# Patient Record
Sex: Male | Born: 1985 | Race: White | Hispanic: No | Marital: Single | Smoking: Never smoker
Health system: Southern US, Community
[De-identification: ages and names within clinical notes are randomized; demographics above are authoritative.]

## PROBLEM LIST (undated history)

## (undated) DIAGNOSIS — I2699 Other pulmonary embolism without acute cor pulmonale: Secondary | ICD-10-CM

## (undated) DIAGNOSIS — D689 Coagulation defect, unspecified: Secondary | ICD-10-CM

## (undated) DIAGNOSIS — I82409 Acute embolism and thrombosis of unspecified deep veins of unspecified lower extremity: Secondary | ICD-10-CM

## (undated) HISTORY — PX: HEMORROIDECTOMY: SUR656

---

## 2010-02-18 ENCOUNTER — Emergency Department (HOSPITAL_BASED_OUTPATIENT_CLINIC_OR_DEPARTMENT_OTHER)
Admission: EM | Admit: 2010-02-18 | Discharge: 2010-02-18 | Payer: Self-pay | Source: Home / Self Care | Admitting: Emergency Medicine

## 2010-09-19 ENCOUNTER — Emergency Department (HOSPITAL_COMMUNITY)

## 2010-09-19 ENCOUNTER — Inpatient Hospital Stay (HOSPITAL_COMMUNITY)
Admission: EM | Admit: 2010-09-19 | Discharge: 2010-09-21 | DRG: 176 | Disposition: A | Discharge status: Home / Self Care | Source: Ambulatory Visit | Attending: Internal Medicine | Admitting: Internal Medicine

## 2010-09-19 DIAGNOSIS — I2699 Other pulmonary embolism without acute cor pulmonale: Principal | ICD-10-CM | POA: Diagnosis present

## 2010-09-19 DIAGNOSIS — Z7901 Long term (current) use of anticoagulants: Secondary | ICD-10-CM

## 2010-09-19 DIAGNOSIS — Z86718 Personal history of other venous thrombosis and embolism: Secondary | ICD-10-CM

## 2010-09-19 LAB — POCT I-STAT, CHEM 8
BUN: 33 mg/dL — ABNORMAL HIGH (ref 6–23)
Calcium, Ion: 1.22 mmol/L (ref 1.12–1.32)
Chloride: 104 mEq/L (ref 96–112)
Glucose, Bld: 87 mg/dL (ref 70–99)

## 2010-09-19 LAB — DIFFERENTIAL
Eosinophils Absolute: 0.1 10*3/uL (ref 0.0–0.7)
Lymphocytes Relative: 32 % (ref 12–46)
Lymphs Abs: 2.4 10*3/uL (ref 0.7–4.0)
Monocytes Relative: 8 % (ref 3–12)
Neutrophils Relative %: 58 % (ref 43–77)

## 2010-09-19 LAB — CBC
HCT: 43.4 % (ref 39.0–52.0)
MCH: 29.9 pg (ref 26.0–34.0)
MCV: 83.6 fL (ref 78.0–100.0)
Platelets: 143 10*3/uL — ABNORMAL LOW (ref 150–400)
RBC: 5.19 MIL/uL (ref 4.22–5.81)

## 2010-09-20 LAB — CARDIAC PANEL(CRET KIN+CKTOT+MB+TROPI)
CK, MB: 4.5 ng/mL — ABNORMAL HIGH (ref 0.3–4.0)
CK, MB: 4.6 ng/mL — ABNORMAL HIGH (ref 0.3–4.0)
Relative Index: 1.8 (ref 0.0–2.5)
Relative Index: 2.2 (ref 0.0–2.5)
Total CK: 249 U/L — ABNORMAL HIGH (ref 7–232)
Total CK: 210 U/L (ref 7–232)
Troponin I: <0.3 ng/mL (ref <0.30)

## 2010-09-20 LAB — BASIC METABOLIC PANEL
CO2: 26 mEq/L (ref 19–32)
Chloride: 104 mEq/L (ref 96–112)
Creatinine, Ser: 1.24 mg/dL (ref 0.50–1.35)
GFR calc Af Amer: >60 mL/min (ref >60)
Potassium: 3.8 mEq/L (ref 3.5–5.1)
Sodium: 137 mEq/L (ref 135–145)

## 2010-09-20 LAB — HOMOCYSTEINE: Homocysteine: 7.5 umol/L (ref 4.0–15.4)

## 2010-09-20 LAB — MAGNESIUM: Magnesium: 2.1 mg/dL (ref 1.5–2.5)

## 2010-09-20 MED ORDER — IOHEXOL 300 MG/ML  SOLN
80.0000 mL | Freq: Once | INTRAMUSCULAR | Status: AC | PRN
  Administered 2010-09-20: 80 mL via INTRAVENOUS

## 2010-09-21 LAB — CBC
MCHC: 34.5 g/dL (ref 30.0–36.0)
Platelets: 130 10*3/uL — ABNORMAL LOW (ref 150–400)
RDW: 13.1 % (ref 11.5–15.5)
WBC: 5.6 10*3/uL (ref 4.0–10.5)

## 2010-09-21 LAB — PROTEIN C ACTIVITY: Protein C Activity: 107 % (ref 75–133)

## 2010-09-21 LAB — PROTEIN S, TOTAL: Protein S Ag, Total: 159 % — ABNORMAL HIGH (ref 60–150)

## 2010-09-21 LAB — LUPUS ANTICOAGULANT PANEL
Lupus Anticoagulant: NOT DETECTED
PTTLA 4:1 Mix: 57.4 secs — ABNORMAL HIGH (ref 30.0–45.6)
PTTLA Confirmation: 2.4 secs (ref <8.0)
dRVVT Incubated 1:1 Mix: 39.2 secs (ref 36.2–44.3)

## 2010-09-21 LAB — FACTOR 5 LEIDEN

## 2010-09-21 LAB — PROTIME-INR: INR: 1.06 (ref 0.00–1.49)

## 2010-09-21 LAB — ANTITHROMBIN III: AntiThromb III Func: 95 % (ref 76–126)

## 2010-09-28 LAB — CARDIOLIPIN ANTIBODIES, IGG, IGM, IGA
Anticardiolipin IgA: 7 APL U/mL — ABNORMAL LOW (ref <22)
Anticardiolipin IgG: 2 GPL U/mL — ABNORMAL LOW (ref <23)
Anticardiolipin IgM: 2 MPL U/mL — ABNORMAL LOW (ref <11)

## 2010-09-28 LAB — BETA-2-GLYCOPROTEIN I ABS, IGG/M/A
Beta-2-Glycoprotein I IgA: 3 A Units (ref <20)
Beta-2-Glycoprotein I IgM: 4 M Units (ref <20)

## 2010-10-16 NOTE — H&P (Signed)
Scott Baldwin, Scott Baldwin             ACCOUNT NO.:  [id number]  MEDICAL RECORD NO.:  [id number]  LOCATION:  [facility]                         FACILITY:  [facility]  PHYSICIAN:  Richarda Overlie, MD       DATE OF BIRTH:  1985/07/13  DATE OF ADMISSION:  09/19/2010 DATE OF DISCHARGE:                             HISTORY & PHYSICAL   PRIMARY CARE PHYSICIAN:  Unassigned.  CHIEF COMPLAINTS:  Shortness of breath.  SUBJECTIVE:  A 25 year old male who presents with a chief complaint of shortness of breath to the ER at around 11:15 p.m.  The patient states that in January he was diagnosed with pulmonary embolism on February 28, 2010, and he was taking Coumadin through August 04, 2010.  The patient started noticing that 2 weeks ago he has some dyspnea on exertion associated with fatigue with extreme physical exertion.  He also complained of occasional chest pain.  He states that he has been having some right knee pain especially when he exerts himself physically.  He has also been taking Celebrex for hand injury.  He feels that his symptoms have been worsening over the last 2 weeks.  The patient denies any cough, chills, rigors, or fever.  He denies any leg swelling, any recent history of travel.  PAST MEDICAL HISTORY:  History of pulmonary embolism, recently completed a 62-month course of Coumadin.  PAST SURGICAL HISTORY:  None.  SOCIAL HISTORY:  Nonsmoker, nonalcoholic.  Denies any history of drug use.  ALLERGIES:  No known drug allergies.  HOME MEDICATIONS:  Celebrex as needed.  REVIEW OF SYSTEMS:  Complete review of systems was done as documented in HPI.  FAMILY HISTORY:  Negative for any history of pulmonary embolism.  PHYSICAL EXAMINATION:  VITAL SIGNS:  Blood pressure 128/71, pulse of 60, respirations 18, temperature 98.0. GENERAL:  Comfortable, in no acute cardiopulmonary distress. HEENT:  Pupils equal and reactive.  Extraocular movements intact. NECK:  Supple.  No JVD. LUNGS:  Clear to  auscultation bilaterally.  No wheezes, crackles, or rhonchi. CARDIOVASCULAR:  Regular rate and rhythm.  No murmurs, rubs, or gallops. ABDOMEN:  Obese, soft, nontender, nondistended. EXTREMITIES:  Without cyanosis, clubbing, or edema. NEUROLOGIC:  Cranial nerves II through XII grossly intact. PSYCHIATRIC:  Appropriate mood and affect.  CT angio shows acute pulmonary embolism with bilateral lobar segmental branches.  No CT evidence of right ventricular strain.  INR 1.06.  WBC 7.5, hemoglobin 15.5, hematocrit 43.4, and platelet count of 143. Sodium 139, potassium 4.4, chloride 104, bicarbonate is 33, creatinine 1.4, glucose 87.  ASSESSMENT AND PLAN:  Acute bilateral pulmonary embolism as evident on CT angio of the chest, which is bilateral lobar and segmental pulmonary embolism without any evidence of right ventricular heart strain.  The patient will be admitted to the telemetry floor.  We will cycle his cardiac enzymes.  We will start him on Lovenox and Coumadin.  The patient will be monitored for arrhythmias.  He may be transitioned to Lovenox at home with continuation of Coumadin with close followup of his PT/INR in an outpatient setting.  We will also order a hypercoagulable panel.  He is a full code.     Richarda Overlie, MD  NA/MEDQ  D:  09/20/2010  T:  09/20/2010  Job:  [id number]  Electronically Signed by Richarda Overlie MD on 10/16/2010 10:24:52 PM

## 2010-10-22 NOTE — Discharge Summary (Signed)
NAMECHAVIS, Scott Baldwin             ACCOUNT NO.:  [id number]  MEDICAL RECORD NO.:  [id number]  LOCATION:                                 FACILITY:  PHYSICIAN:  Clydia Llano, MD       DATE OF BIRTH:  05-08-1985  DATE OF ADMISSION: DATE OF DISCHARGE:                              DISCHARGE SUMMARY   PRIMARY CARE PHYSICIAN:  Lucendia Herrlich, nurse practitioner at [facility] [employer].  REASON FOR ADMISSION:  Shortness of breath.  DISCHARGE DIAGNOSES: 1. Acute bilateral pulmonary embolism, recurrent. 2. 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  DISCHARGE MEDICATIONS: 1. Lovenox 100 mg subcu b.i.d. for seven more days. 2. Coumadin 10 mg p.o. daily. 3. Fish oil OTC 2 capsules p.o. b.i.d. 4. Celebrex 100 mg p.o. b.i.d.  BRIEF HISTORY AND EXAMINATION:  Mr. Scott Baldwin is a 25 year old male with past medical history of PE, diagnosed earlier this year in January.  The patient was taking Coumadin until August 04, 2010.  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  He was also diagnosed with right lower extremity DVT as he mentioned.  He was put on Coumadin for 6 months and was discontinued in the beginning of June.  The patient started to notice about 2 weeks ago to have some dyspnea which progressed associated with fatigue and extreme physical exertion.  The patient also has some occasional chest pain.  The patient came into the hospital for further medical evaluation.  Upon initial evaluation in the emergency department, CT angio of the chest was done showed acute pulmonary embolism.  RADIOLOGY:  His CT angio showed acute pulmonary embolism with an bilateral lobar and segmental branches.  There is no CT evidence of right ventricular heart strain.  BRIEF HOSPITAL COURSE: 1. Bilateral PE.  As mentioned above, this is recurrent.  The first     time it was diagnosed in January 2012.  The patient did not know     what  was the cause at that time.  The patient also does not have     any risk factors because of his age.  Hypercoagulable panel was     drawn in [facility] emergency department.  This can be obtained at     the request.  It still pending at the time of discharge.  The only     things returned was antithrombin III enzymes, which is normal at 90     and homocysteine level normal at 7.5.  The rest of it is pending.     The patient admitted to telemetry bed, although, there is no     evidence of heart strain on the CT scan, was put on telemetry and     treated with heparin initially and then switched to subcutaneous     Lovenox with Coumadin.  His INR at the time of discharge is 1.0.     The patient on 10 mg of Coumadin and he will be overlapping with     the Lovenox for at least 7 days or INR more than between 2 and 3     for  2 days while he is on Lovenox.  The duration of the     anticoagulation will be a question.  This is his second PE after     only about 1 month off of the anticoagulation.  I would recommend     to wait to see the hypercoagulable panel, and if the patient has     hypercoagulable state, he might require the anticoagulation for     life.  He might also consider to refer to hematologist. 2. Prior history of right lower extremity DVT.  Doppler of the lower     extremity was not done at this time because it was not changed the     management as the patient will get at least 6 months of     anticoagulation.  DISCHARGE INSTRUCTIONS: 1. Activity:  As tolerated. 2. Diet:  Regular. 3. Disposition:  Home.  SPECIAL INSTRUCTION:  Check INR on Monday when see his primary care physician.     Clydia Llano, MD     ME/MEDQ  D:  09/21/2010  T:  09/21/2010  Job:  [id number]  cc:   Lucendia Herrlich, FNP  Electronically Signed by Clydia Llano  on 10/22/2010 09:58:36 AM

## 2011-02-11 ENCOUNTER — Emergency Department (HOSPITAL_BASED_OUTPATIENT_CLINIC_OR_DEPARTMENT_OTHER)
Admission: EM | Admit: 2011-02-11 | Discharge: 2011-02-11 | Disposition: A | Discharge status: Home / Self Care | Payer: Managed Care, Other (non HMO) | Attending: Emergency Medicine | Admitting: Emergency Medicine

## 2011-02-11 DIAGNOSIS — M79601 Pain in right arm: Secondary | ICD-10-CM

## 2011-02-11 DIAGNOSIS — R791 Abnormal coagulation profile: Secondary | ICD-10-CM | POA: Insufficient documentation

## 2011-02-11 DIAGNOSIS — Z86718 Personal history of other venous thrombosis and embolism: Secondary | ICD-10-CM | POA: Insufficient documentation

## 2011-02-11 DIAGNOSIS — M79609 Pain in unspecified limb: Secondary | ICD-10-CM | POA: Insufficient documentation

## 2011-02-11 HISTORY — DX: Acute embolism and thrombosis of unspecified deep veins of unspecified lower extremity: I82.409

## 2011-02-11 HISTORY — DX: Coagulation defect, unspecified: D68.9

## 2011-02-11 HISTORY — DX: Other pulmonary embolism without acute cor pulmonale: I26.99

## 2011-02-11 MED ORDER — HYDROCODONE-ACETAMINOPHEN 5-325 MG PO TABS
1.0000 | ORAL_TABLET | Freq: Once | ORAL | Status: AC
  Administered 2011-02-11: 1 via ORAL
  Filled 2011-02-11: qty 1

## 2011-02-11 MED ORDER — HYDROCODONE-ACETAMINOPHEN 5-325 MG PO TABS: 1.0000 | ORAL_TABLET | Freq: Four times a day (QID) | ORAL | Status: AC | PRN

## 2011-02-11 NOTE — ED Provider Notes (Signed)
History     CSN: [id number] Arrival date & time: 02/11/2011  8:38 AM   First MD Initiated Contact with Patient 02/11/11 [phone]      Chief Complaint  Patient presents with  . Arm Pain    (Consider location/radiation/quality/duration/timing/severity/associated sxs/prior treatment) Patient is a 25 y.o. male presenting with arm pain.  Arm Pain Associated symptoms include myalgias and weakness. Pertinent negatives include no abdominal pain, arthralgias, chest pain, fever, headaches, joint swelling, nausea, neck pain or rash.   Mr. 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  Mr. Clarita Leber states he was doing bicep curles on Friday night and started to notice a pain in his right forearm. There was no acute onset. No pulling, snapping or acute pain. He states this was a soreness located first near the lateral epicondyle and proximal lateral forearm. On Saturday he had increasing stiffness in the right upper arm. On Sunday he awoke with a sore "knot" in the proximal, medial portion of his RUE.   He currently describes the pain as a stain-like pulling in the RUE and a sore ache in near the R lateral epicondyle. He denies any radiation. The pain is aggravated by straightening the arm and stretching. It is relieved by resting the arm. He took Tylenol this morning for his arm pain but states it did not help relieve it. The pain is 6/10 upon stretching the arm and 2/10 at rest. The pain does not radiate.  Mr Aggie Hacker states he has been changing his diet around recently and has an appointment with his warfarin clinic provider on Thursday.  Denies any chest pain, shortness of breath, lower extremity pain, lower extremity swelling, bruising or rash.    Past Medical History  Diagnosis Date  . Clotting disorder   . Pulmonary embolism   . DVT (deep venous thrombosis)     Past  Surgical History  Procedure Date  . Hemorroidectomy     No family history on file.  History  Substance Use Topics  . Smoking status: Never Smoker   . Smokeless tobacco: Never Used  . Alcohol Use: No      Review of Systems  Constitutional: Negative for fever and activity change.  HENT: Negative for neck pain.   Respiratory: Negative for chest tightness, shortness of breath and wheezing.   Cardiovascular: Negative for chest pain, palpitations and leg swelling.  Gastrointestinal: Negative for nausea, abdominal pain and abdominal distention.  Musculoskeletal: Positive for myalgias. Negative for joint swelling and arthralgias.  Skin: Negative for color change, pallor, rash and wound.  Neurological: Positive for weakness. Negative for dizziness and headaches.    Allergies  Review of patient's allergies indicates no known allergies.  Home Medications   Current Outpatient Rx  Name Route Sig Dispense Refill  . MULTIVITAMINS PO CAPS Oral Take 1 capsule by mouth daily.      . WARFARIN SODIUM 1 MG PO TABS Oral Take 10 mg by mouth as directed.      . WARFARIN SODIUM 1 MG PO TABS Oral Take 15 mg by mouth once a week. Tuesday     . HYDROCODONE-ACETAMINOPHEN 5-325 MG PO TABS Oral Take 1 tablet by mouth every 6 (six) hours as needed for pain. 30 tablet 0    BP 145/88  Pulse 79  Temp(Src) 97.7 F (36.5 C) (Oral)  Resp 18  Ht 5\' 7"  (  1.702 m)  Wt 230 lb (104.327 kg)  BMI 36.02 kg/m2  SpO2 100%  Physical Exam  Constitutional: He is oriented to person, place, and time. He appears well-developed and well-nourished. No distress.  HENT:  Head: Normocephalic and atraumatic.  Eyes: Conjunctivae are normal. Pupils are equal, round, and reactive to light.  Neck: Normal range of motion.  Cardiovascular: Normal rate, regular rhythm, normal heart sounds and intact distal pulses.  Exam reveals no gallop and no friction rub.   No murmur heard. Pulmonary/Chest: Effort normal and breath sounds  normal. No respiratory distress. He has no wheezes. He has no rales. He exhibits no tenderness.  Abdominal: Soft. Bowel sounds are normal. He exhibits no distension. There is no tenderness.  Musculoskeletal: He exhibits tenderness.       Right shoulder: He exhibits normal range of motion, no tenderness, no bony tenderness, no deformity and no pain.       Right elbow: tenderness found. Lateral epicondyle tenderness noted.       Right wrist: Normal.       Arms:      Asymmetry between the left and right arm. Right arm is slightly larger with a 4 cm subcutaneous mass on the proximal medial upper arm. Radial pulses intact bilaterally. No pallor, rash, ecchymosis, or skin change beside stretch marks in the right arm.  Neurological: He is alert and oriented to person, place, and time. No cranial nerve deficit.  Skin: Skin is warm, dry and intact. No abrasion, no bruising and no rash noted. He is not diaphoretic. No cyanosis or erythema. No pallor.  Psychiatric: He has a normal mood and affect. His behavior is normal. Judgment and thought content normal.    ED Course  Procedures (including critical care time)  Labs Reviewed  PROTIME-INR - Abnormal; Notable for the following:    Prothrombin Time 48.2 (*)    INR 5.15 (*)    All other components within normal limits   No results found.   1. Arm pain, right   2. Supratherapeutic INR       MDM  Pt's pain is likely secondary to partial tear of brachialis or coracobrachialis muscle in the RUE with or without concomitant hematoma. Upper extremity DVT is unlikely, as INR is supratherapeutic, which he was at 5.15 today. No tenseness, pallor, paraesthesia, or loss of pulses in the RUE to suggest compartment syndrome. Will refer to sports medicine in [place] for follow-up in the next week. Will treat pain with short course of Vicodin. Will have pt hold today's and tomorrow's doses of his warfarin and follow-up with Beecher Mcardle in the warfarin clinic  at [facility] to adjust his warfarin dose.         Quentin Ore, MD 02/11/11 1004

## 2011-02-11 NOTE — ED Notes (Signed)
Pt c/o pain in right bicep that started Friday after lifting weights.

## 2011-02-11 NOTE — ED Provider Notes (Signed)
I saw and evaluated the patient, reviewed the resident's note and I agree with the findings and plan.  This is mild tenderness in his right bicep without signs of compartment syndrome.  He has normal right radial pulse.  He has normal strength in his right upper extremity.  I suspect this is a muscle strain.  His INR is elevated he's and told to hold his Coumadin x2 days and recheck with his primary care Dr. regarding his INR.  I doubt this is a DVT especially given his supratherapeutic INR.  DC home with pain medicine and referral to sports medicine for likely outpatient physical therapy and imaging such as MRI as warranted  1. Arm pain, right   2. Supratherapeutic INR    Results for orders placed during the hospital encounter of 02/11/11  PROTIME-INR      Component Value Range   Prothrombin Time 48.2 (*) 11.6 - 15.2 (seconds)   INR 5.15 (*) 0.00 - 1.49      Lyanne Co, MD 02/11/11 1009

## 2011-02-11 NOTE — ED Notes (Signed)
MD at bedside. 

## 2011-02-14 ENCOUNTER — Ambulatory Visit
Admission: RE | Admit: 2011-02-14 | Discharge: 2011-02-14 | Disposition: A | Discharge status: Home / Self Care | Source: Ambulatory Visit | Attending: Orthopedic Surgery | Admitting: Orthopedic Surgery

## 2011-02-14 ENCOUNTER — Other Ambulatory Visit: Payer: Self-pay | Admitting: Orthopedic Surgery

## 2011-02-14 DIAGNOSIS — M25521 Pain in right elbow: Secondary | ICD-10-CM

## 2011-02-20 ENCOUNTER — Ambulatory Visit: Admitting: Family Medicine

## 2011-03-11 ENCOUNTER — Ambulatory Visit: Admitting: Physical Therapy

## 2012-07-13 IMAGING — CT CT ANGIO CHEST
2 of 7 series · 18 of 36 positions shown · IV contrast (CONTRAST)
Comparison: None.

CLINICAL DATA: Shortness of breath

CT ANGIOGRAPHY CHEST WITH CONTRAST
TECHNIQUE: Multidetector CT imaging of the chest was performed
using the standard protocol during bolus administration of
intravenous contrast.  Multiplanar CT image reconstructions
including MIPs were obtained to evaluate the vascular anatomy.
Contrast:  80 ml of Mmnipaque-UPP intravenous contrast

[Series 5: pe thins · axial · 0.71mm/px · z∈[-279,-32]mm · 17 of 279 slices shown]
[im 16/279  lung]
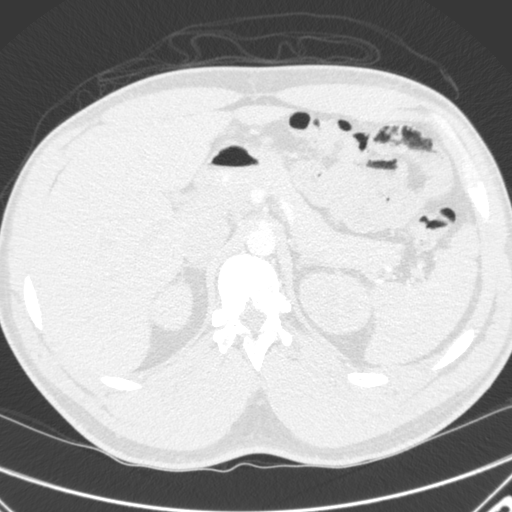
[im 31/279  mediastinal]
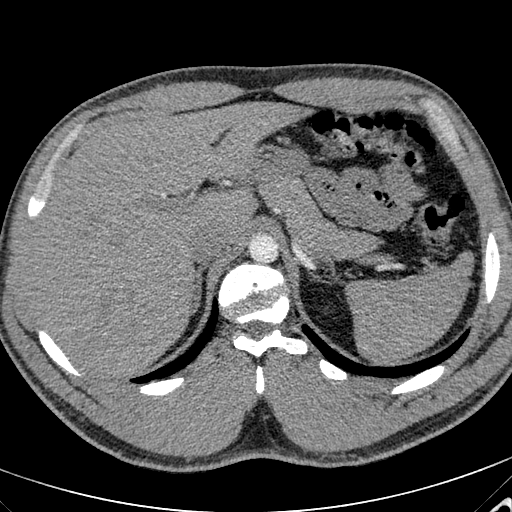
[im 47/279  lung]
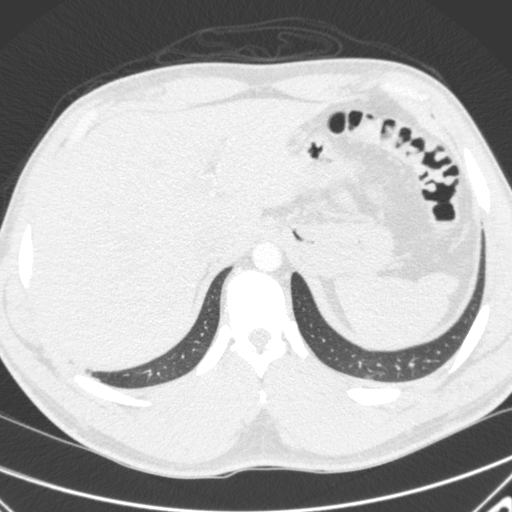
[im 62/279  mediastinal]
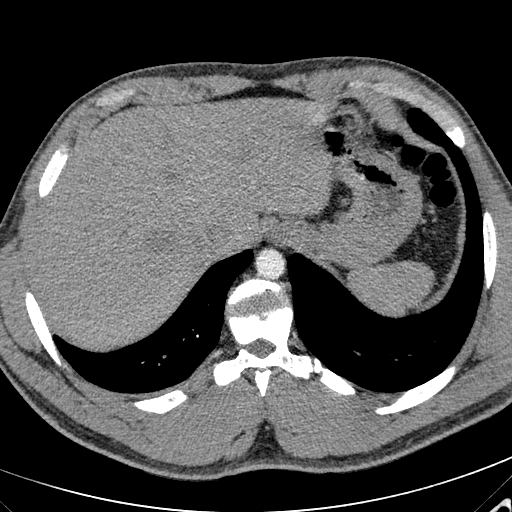
[im 78/279  lung]
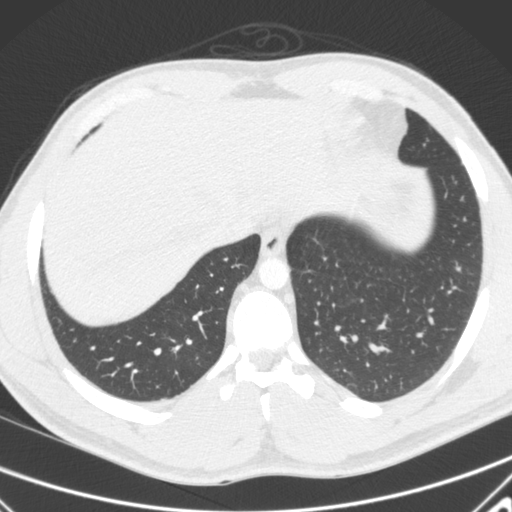
[im 93/279  mediastinal]
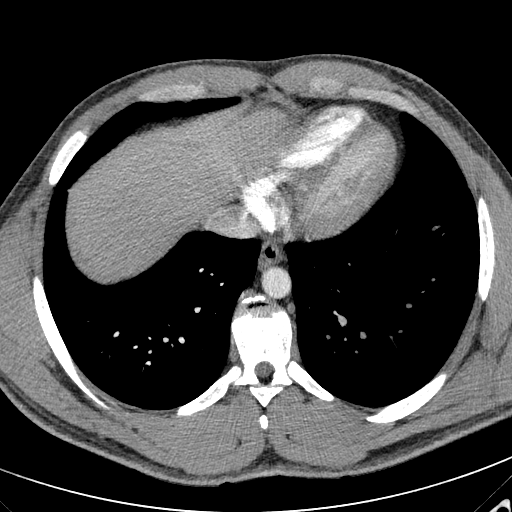
[im 109/279  lung]
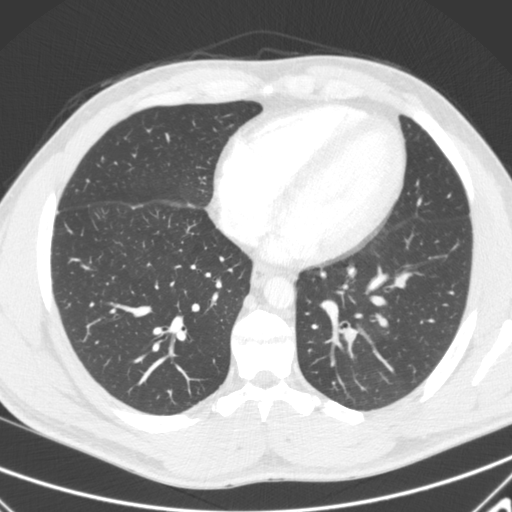
[im 124/279  mediastinal]
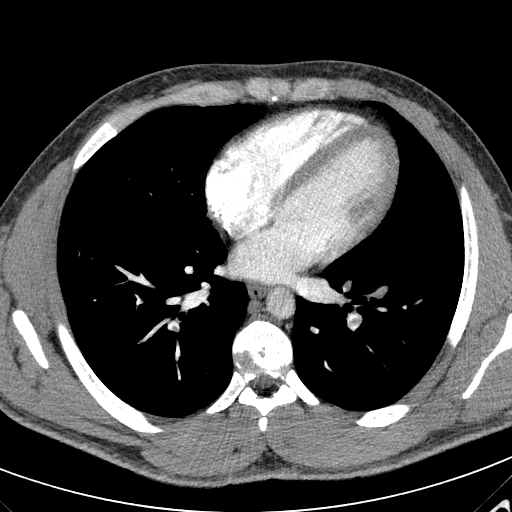
[im 140/279  lung]
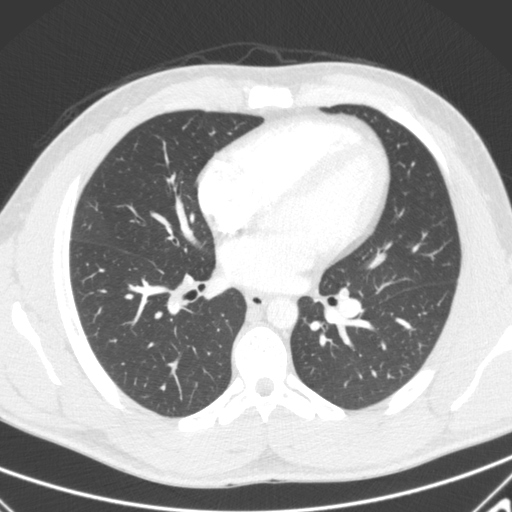
[im 155/279  mediastinal]
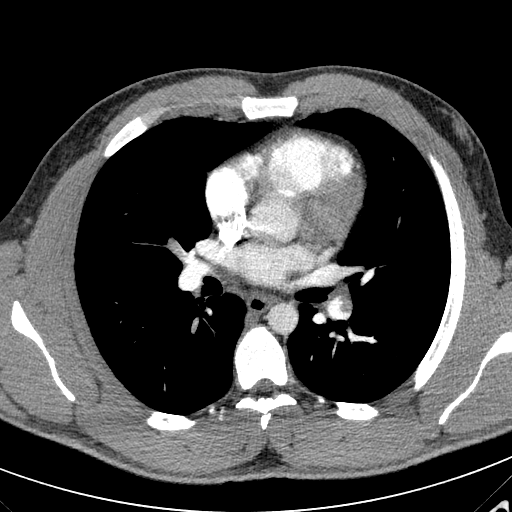
[im 170/279  lung]
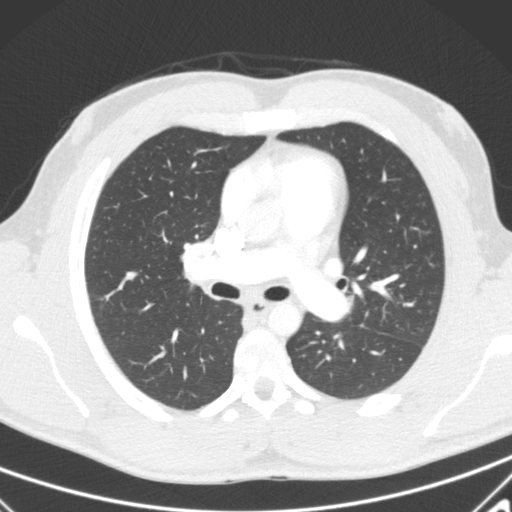
[im 186/279  mediastinal]
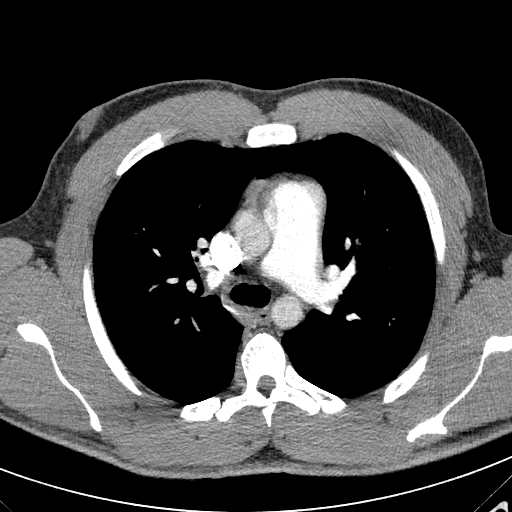
[im 201/279  lung]
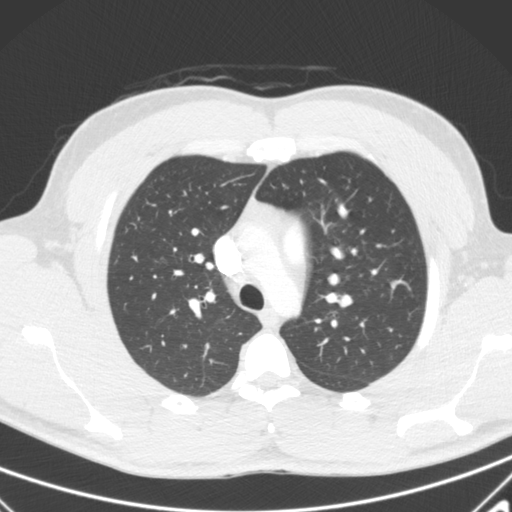
[im 217/279  mediastinal]
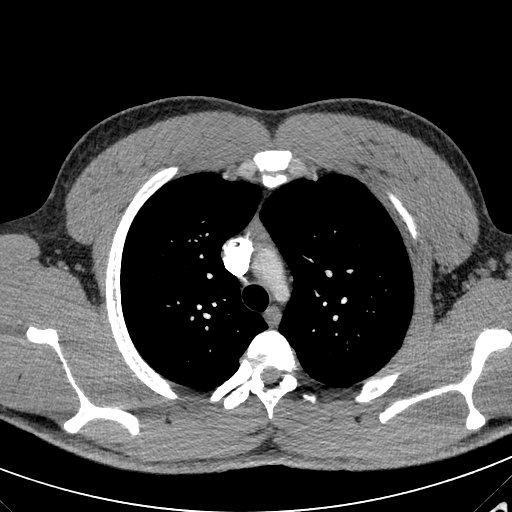
[im 232/279  lung]
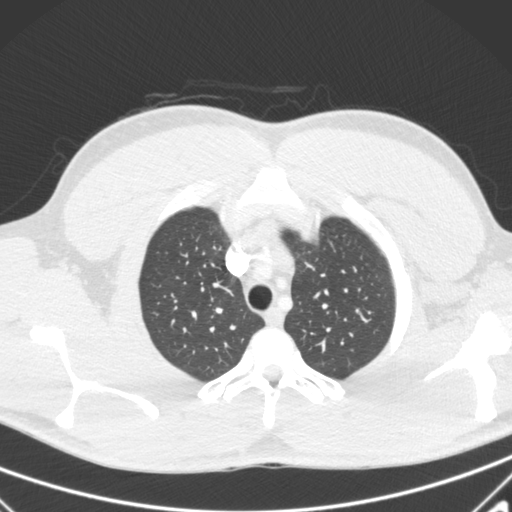
[im 248/279  mediastinal]
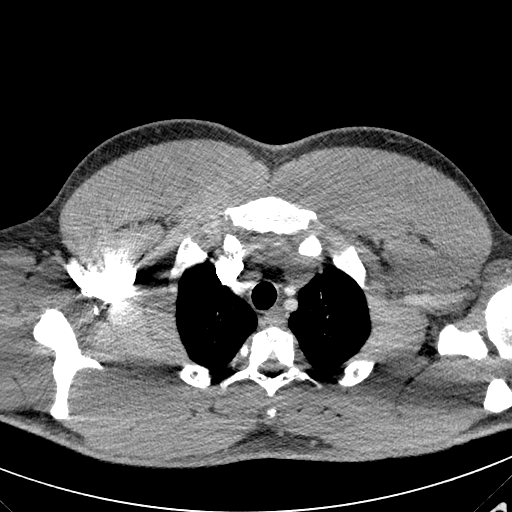
[im 263/279  lung]
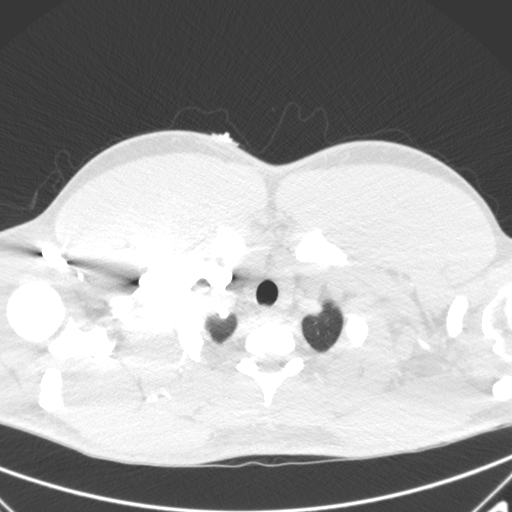

[mpr, coronals, coronal · coronal · 0.71mm/px · 1 of 148 slices shown]
[im 74/148  mediastinal]
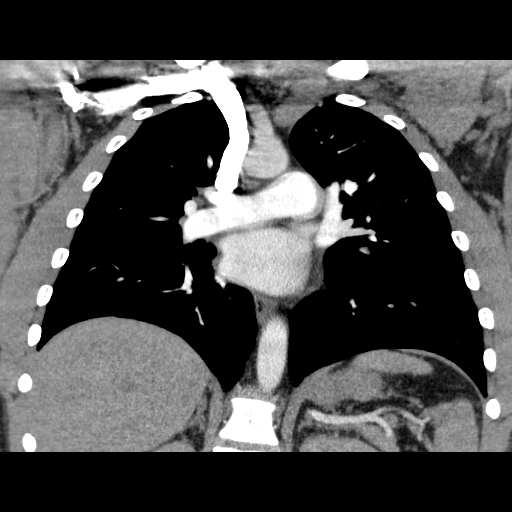

[18 of 36 positions shown; findings below may reference images not displayed]

FINDINGS: Bilateral lobar and segmental branch filling defects to
lower lobe and right middle lobe branches, in keeping with acute
pulmonary emboli.

Heart size within normal limits.  No definite evidence for right
ventricular heart strain.  No pericardial or pleural effusion.  No
lymphadenopathy.

Limited images through the upper abdomen show no acute abnormality.

Central airways are patent.  Lungs are predominately clear.  No
pneumothorax.

No aggressive osseous lesions, multilevel Schmorl's nodes and mild
height loss, can be seen with Ri disease.

Review of the MIP images confirms the above findings.
IMPRESSION: Acute pulmonary emboli within bilateral lobar and segmental
branches.  No CT evidence for right ventricular heart strain.

## 2013-07-22 ENCOUNTER — Encounter (HOSPITAL_COMMUNITY): Payer: Self-pay | Admitting: Emergency Medicine

## 2013-07-22 ENCOUNTER — Emergency Department (HOSPITAL_COMMUNITY)
Admission: EM | Admit: 2013-07-22 | Discharge: 2013-07-22 | Disposition: A | Discharge status: Home / Self Care | Payer: Non-veteran care | Attending: Emergency Medicine | Admitting: Emergency Medicine

## 2013-07-22 DIAGNOSIS — K644 Residual hemorrhoidal skin tags: Secondary | ICD-10-CM | POA: Diagnosis not present

## 2013-07-22 DIAGNOSIS — K5900 Constipation, unspecified: Secondary | ICD-10-CM

## 2013-07-22 DIAGNOSIS — Z9889 Other specified postprocedural states: Secondary | ICD-10-CM | POA: Diagnosis not present

## 2013-07-22 DIAGNOSIS — K4090 Unilateral inguinal hernia, without obstruction or gangrene, not specified as recurrent: Secondary | ICD-10-CM | POA: Diagnosis present

## 2013-07-22 DIAGNOSIS — T148XXA Other injury of unspecified body region, initial encounter: Secondary | ICD-10-CM

## 2013-07-22 DIAGNOSIS — Y939 Activity, unspecified: Secondary | ICD-10-CM | POA: Diagnosis not present

## 2013-07-22 DIAGNOSIS — Z86711 Personal history of pulmonary embolism: Secondary | ICD-10-CM | POA: Insufficient documentation

## 2013-07-22 DIAGNOSIS — Y929 Unspecified place or not applicable: Secondary | ICD-10-CM | POA: Insufficient documentation

## 2013-07-22 DIAGNOSIS — X58XXXA Exposure to other specified factors, initial encounter: Secondary | ICD-10-CM | POA: Diagnosis not present

## 2013-07-22 DIAGNOSIS — Z7901 Long term (current) use of anticoagulants: Secondary | ICD-10-CM | POA: Diagnosis not present

## 2013-07-22 DIAGNOSIS — Z86718 Personal history of other venous thrombosis and embolism: Secondary | ICD-10-CM | POA: Insufficient documentation

## 2013-07-22 DIAGNOSIS — IMO0002 Reserved for concepts with insufficient information to code with codable children: Secondary | ICD-10-CM | POA: Diagnosis not present

## 2013-07-22 LAB — POC OCCULT BLOOD, ED: Fecal Occult Bld: NEGATIVE

## 2013-07-22 MED ORDER — POLYETHYLENE GLYCOL 3350 17 G PO PACK: 17.0000 g | PACK | Freq: Two times a day (BID) | ORAL | Status: AC

## 2013-07-22 NOTE — ED Notes (Signed)
Per patient: has had an inguinal hernia since October 2013, has seen a physician since that time but states since last Thursday he has had difficulty having a bowel movement and states that there is a dull pain constantly but when he strains to have a bowel movement the pain increases but not excruciatingly so.  Has not had a normal bowel movement since last Thursday and states that there is pressure also when he urinates in his lower abdomen.  Denies fever and chills, left inguinal hernia noticed that looks reduced but recurs with straining.

## 2013-07-22 NOTE — ED Notes (Signed)
Dr. Thekkekandam at bedside 

## 2013-07-22 NOTE — Discharge Instructions (Signed)
You likely have a muscle strain along with constipation causing your symptoms. Take miralax daily or twice daily until stooling normally. You can also take colace (stool softener) to help if needed. Be sure to stay hydrated and eat foods high in fiber. This does not seem to be related to the hernia though it is likely you had a small muscle strain causing some of your pain. There was no blood in your stool today. Your bleeding was likely from your hemorrhoid. If you develop fevers, nausea, vomiting, or other concerns, seek immediate care.   Constipation, Adult Constipation is when a person:  Poops (bowel movement) less than 3 times a week.  Has a hard time pooping.  Has poop that is dry, hard, or bigger than normal. HOME CARE   Eat more fiber, such as fruits, vegetables, whole grains like brown rice, and beans.  Eat less fatty foods and sugar. This includes [place] fries, hamburgers, cookies, candy, and soda.  If you are not getting enough fiber from food, take products with added fiber in them (supplements).  Drink enough fluid to keep your pee (urine) clear or pale yellow.  Go to the restroom when you feel like you need to poop. Do not hold it.  Only take medicine as told by your doctor. Do not take medicines that help you poop (laxatives) without talking to your doctor first.  Exercise on a regular basis, or as told by your doctor. GET HELP RIGHT AWAY IF:   You have bright red blood in your poop (stool).  Your constipation lasts more than 4 days or gets worse.  You have belly (abdomen) or butt (rectal) pain.  You have thin poop (as thin as a pencil).  You lose weight, and it cannot be explained. MAKE SURE YOU:   Understand these instructions.  Will watch your condition.  Will get help right away if you are not doing well or get worse. Document Released: 07/31/2007 Document Revised: 05/06/2011 Document Reviewed: 11/23/2012 [facility] Patient Information 2014  [place], [place].  Leona Singleton, MD

## 2013-07-22 NOTE — ED Provider Notes (Signed)
CSN: [id number]     Arrival date & time 07/22/13  0907 History   First MD Initiated Contact with Patient 07/22/13 0915     Chief Complaint  Patient presents with  . Inguinal Hernia    HPI  28 y.o. 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 He reports having a left lower abdominal hernia that has been painful since lifting heavy weights 1 week ago. He denies protrusion or swelling, but has noticed pressure sensation in rectum and decreased stooling over the past week. Pain is in LLQ and is 5/10 "pressure-like" sensation that extends into rectum. He has hemorrhoids with occasional bleeding and has noticed blood when wiping and blood in stool that he thinks is mixed in. He also endorses mild pain underneath testicles. Denies fevers, chills, nausea, vomiting, change in urine quality, penile discomfort or discharge, testicular discomfort, or trauma.   Past Medical History  Diagnosis Date  . Clotting disorder   . Pulmonary embolism   . DVT (deep venous thrombosis)   Multiple prior DVT/PEs. On xarelto, though taking only 1-2 times weekly due to not wanting to take it daily.  Past Surgical History  Procedure Laterality Date  . Hemorroidectomy    most recently in 2013.  No family history on file. History  Substance Use Topics  . Smoking status: Never Smoker   . Smokeless tobacco: Never Used  . Alcohol Use: No    Review of Systems  All other systems reviewed and are negative.   Allergies  Review of patient's allergies indicates no known allergies.  Home Medications   Prior to Admission medications   Medication Sig Start Date End Date Taking? Authorizing Provider  Multiple Vitamin (MULTIVITAMIN) capsule Take 1 capsule by mouth daily.      Historical Provider, MD  warfarin (COUMADIN) 1 MG tablet Take 10 mg by mouth as directed.      Historical Provider, MD  warfarin (COUMADIN) 1 MG tablet Take 15 mg by mouth once a week.  Tuesday     Historical Provider, MD   BP 130/75  Pulse 91  Temp(Src) 97.9 F (36.6 C) (Oral)  Resp 18  Ht 5\' 10"  (1.778 m)  Wt 240 lb (108.863 kg)  BMI 34.44 kg/m2  SpO2 100% Physical Exam GEN: NAD HEENT: Atraumatic, normocephalic, neck supple, EOMI, sclera clear, o/p clear  CV: RRR, no murmurs, rubs, or gallops, 2+ bilateral PT pulse PULM: CTAB, normal effort ABD: Soft, mildly tender left lower quadrant, nondistended, NABS, no organomegaly, no defect/deformity seen SKIN: No rash or cyanosis; warm and well-perfused EXTR: No lower extremity edema or calf tenderness PSYCH: Mood and affect euthymic, normal rate and volume of speech NEURO: Awake, alert, no focal deficits grossly, normal speech GU: Mild tenderness underneath testicles with no erythema, induration, or swelling; No obvious testicular deformity, swelling, or tenderness. No erythema. No inguinal hernia appreciated or palpated. No penile abnormality. Rectal: external hemorrhoids present, nonbleeding, nontender; no fissure or other abnormality seen; no abnormalities on palpation of rectum  ED Course  Procedures (including critical care time) Labs Review Labs Reviewed  POC OCCULT BLOOD, ED    Imaging Review No results found.   EKG Interpretation None      MDM   Final diagnoses:  Muscle strain  Constipation  External hemorrhoid   28 y.o. male on xarelto with 1 week LLQ pain and difficulty stooling with some lower abd pain with urination. DDx includes hemorrhoid pain, pulled/torn muscle with weight  lifting on xarelto, constipation, or hernia. No hernia visualized on exam. No signs of an incarcerated bowel. Most likely muscle strain and hemorrhoid related bleeding. Will also treat for constipation. Well-appearing with normal vitals in ED. - Rectal exam with external hemorrhoid visualized. Likely culprit for bleeding. FOBT negative here. - Tylenol PRN pain. - Stool softener daily until stooling normally to help  prevent straining.  - Recommended better compliance with xarelto and f/u with PCP, and only hold if large volume bleeding. - Return precautions reviewed.  Leona SingletonMaria T Redell Nazir, MD PGY-2, [facility]       Leona SingletonMaria T Kanylah Muench, MD 07/22/13 1120

## 2013-07-23 ENCOUNTER — Emergency Department (HOSPITAL_COMMUNITY)
Admission: EM | Admit: 2013-07-23 | Discharge: 2013-07-23 | Disposition: A | Discharge status: Home / Self Care | Payer: Non-veteran care | Attending: Emergency Medicine | Admitting: Emergency Medicine

## 2013-07-23 ENCOUNTER — Encounter (HOSPITAL_COMMUNITY): Payer: Self-pay | Admitting: Emergency Medicine

## 2013-07-23 DIAGNOSIS — R1033 Periumbilical pain: Secondary | ICD-10-CM | POA: Diagnosis not present

## 2013-07-23 DIAGNOSIS — R109 Unspecified abdominal pain: Secondary | ICD-10-CM

## 2013-07-23 DIAGNOSIS — D689 Coagulation defect, unspecified: Secondary | ICD-10-CM | POA: Insufficient documentation

## 2013-07-23 DIAGNOSIS — Z86711 Personal history of pulmonary embolism: Secondary | ICD-10-CM | POA: Insufficient documentation

## 2013-07-23 DIAGNOSIS — Z79899 Other long term (current) drug therapy: Secondary | ICD-10-CM | POA: Insufficient documentation

## 2013-07-23 DIAGNOSIS — Z86718 Personal history of other venous thrombosis and embolism: Secondary | ICD-10-CM | POA: Insufficient documentation

## 2013-07-23 DIAGNOSIS — Z7901 Long term (current) use of anticoagulants: Secondary | ICD-10-CM | POA: Insufficient documentation

## 2013-07-23 DIAGNOSIS — K4090 Unilateral inguinal hernia, without obstruction or gangrene, not specified as recurrent: Secondary | ICD-10-CM | POA: Diagnosis present

## 2013-07-23 LAB — CBC WITH DIFFERENTIAL/PLATELET
BASOS ABS: 0.2 10*3/uL — AB (ref 0.0–0.1)
Basophils Relative: 2 % — ABNORMAL HIGH (ref 0–1)
EOS PCT: 1 % (ref 0–5)
Eosinophils Absolute: 0.1 10*3/uL (ref 0.0–0.7)
HCT: 39.6 % (ref 39.0–52.0)
Hemoglobin: 13.8 g/dL (ref 13.0–17.0)
Lymphocytes Relative: 50 % — ABNORMAL HIGH (ref 12–46)
Lymphs Abs: 3.9 10*3/uL (ref 0.7–4.0)
MCH: 30.3 pg (ref 26.0–34.0)
MCHC: 34.8 g/dL (ref 30.0–36.0)
MCV: 87 fL (ref 78.0–100.0)
MONO ABS: 0.9 10*3/uL (ref 0.1–1.0)
Monocytes Relative: 11 % (ref 3–12)
NEUTROS PCT: 36 % — AB (ref 43–77)
Neutro Abs: 2.9 10*3/uL (ref 1.7–7.7)
PLATELETS: 143 10*3/uL — AB (ref 150–400)
RBC: 4.55 MIL/uL (ref 4.22–5.81)
RDW: 16.1 % — ABNORMAL HIGH (ref 11.5–15.5)
WBC: 8 10*3/uL (ref 4.0–10.5)

## 2013-07-23 LAB — COMPREHENSIVE METABOLIC PANEL
ALBUMIN: 3.9 g/dL (ref 3.5–5.2)
ALT: 149 U/L — AB (ref 0–53)
AST: 75 U/L — AB (ref 0–37)
Alkaline Phosphatase: 56 U/L (ref 39–117)
BUN: 24 mg/dL — ABNORMAL HIGH (ref 6–23)
CALCIUM: 8.7 mg/dL (ref 8.4–10.5)
CO2: 23 meq/L (ref 19–32)
Chloride: 105 mEq/L (ref 96–112)
Creatinine, Ser: 1.29 mg/dL (ref 0.50–1.35)
GFR calc Af Amer: 85 mL/min — ABNORMAL LOW (ref >90)
GFR, EST NON AFRICAN AMERICAN: 74 mL/min — AB (ref >90)
Glucose, Bld: 92 mg/dL (ref 70–99)
Potassium: 4.5 mEq/L (ref 3.7–5.3)
SODIUM: 140 meq/L (ref 137–147)
TOTAL PROTEIN: 6.4 g/dL (ref 6.0–8.3)
Total Bilirubin: 0.7 mg/dL (ref 0.3–1.2)

## 2013-07-23 LAB — I-STAT CG4 LACTIC ACID, ED: Lactic Acid, Venous: 0.6 mmol/L (ref 0.5–2.2)

## 2013-07-23 MED ORDER — HYDROCODONE-ACETAMINOPHEN 5-325 MG PO TABS: 2.0000 | ORAL_TABLET | ORAL | Status: AC | PRN

## 2013-07-23 MED ORDER — OXYCODONE HCL 5 MG PO TABS
5.0000 mg | ORAL_TABLET | Freq: Once | ORAL | Status: AC
  Administered 2013-07-23: 5 mg via ORAL
  Filled 2013-07-23: qty 1

## 2013-07-23 MED ORDER — OXYCODONE-ACETAMINOPHEN 5-325 MG PO TABS
1.0000 | ORAL_TABLET | Freq: Once | ORAL | Status: AC
  Administered 2013-07-23: 1 via ORAL
  Filled 2013-07-23: qty 1

## 2013-07-23 NOTE — ED Notes (Signed)
Pt A&OX4, ambulatory at discharge with slow, steady gait, NAD. 

## 2013-07-23 NOTE — ED Notes (Signed)
Dr. Kunz at bedside.  

## 2013-07-23 NOTE — ED Provider Notes (Signed)
I saw and evaluated the patient, reviewed the resident's note and I agree with the findings and plan.   .Face to face Exam:  General:  Awake HEENT:  Atraumatic Resp:  Normal effort Abd:  Nondistended Neuro:No focal weakness   Nelia Shi, MD 07/23/13 [phone]

## 2013-07-23 NOTE — Discharge Instructions (Signed)
Abdominal Pain, Adult °Many things can cause abdominal pain. Usually, abdominal pain is not caused by a disease and will improve without treatment. It can often be observed and treated at home. Your health care provider will do a physical exam and possibly order blood tests and X-rays to help determine the seriousness of your pain. However, in many cases, more time must pass before a clear cause of the pain can be found. Before that point, your health care provider may not know if you need more testing or further treatment. °HOME CARE INSTRUCTIONS  °Monitor your abdominal pain for any changes. The following actions may help to alleviate any discomfort you are experiencing: °· Only take over-the-counter or prescription medicines as directed by your health care provider. °· Do not take laxatives unless directed to do so by your health care provider. °· Try a clear liquid diet (broth, tea, or water) as directed by your health care provider. Slowly move to a bland diet as tolerated. °SEEK MEDICAL CARE IF: °· You have unexplained abdominal pain. °· You have abdominal pain associated with nausea or diarrhea. °· You have pain when you urinate or have a bowel movement. °· You experience abdominal pain that wakes you in the night. °· You have abdominal pain that is worsened or improved by eating food. °· You have abdominal pain that is worsened with eating fatty foods. °SEEK IMMEDIATE MEDICAL CARE IF:  °· Your pain does not go away within 2 hours. °· You have a fever. °· You keep throwing up (vomiting). °· Your pain is felt only in portions of the abdomen, such as the right side or the left lower portion of the abdomen. °· You pass bloody or black tarry stools. °MAKE SURE YOU: °· Understand these instructions.   °· Will watch your condition.   °· Will get help right away if you are not doing well or get worse.   °Document Released: 11/21/2004 Document Revised: 12/02/2012 Document Reviewed: 10/21/2012 °[employer]® Patient  Information ©2014 [employer]. ° °

## 2013-07-23 NOTE — ED Provider Notes (Signed)
CSN: [id number]     Arrival date & time 07/23/13  1622 History   First MD Initiated Contact with Patient 07/23/13 2043     Chief Complaint  Patient presents with  . Inguinal Hernia      Patient is a 28 year old male with past medical history as below relevant for a clotting disorder, prior DVT/PE, being on her although, and known ventral wall abdominal hernia presents with abdominal pain. Patient reports that for the last several days has been noticing intermittent bouts of abdominal pain at site of his hernia. He feels like his hernia is not able to be pushed back in. He has no other complaints at this time. He was seen here yesterday for rectal bleeding abdominal pain. Rectal exam visualized external hemorrhoids, and patient reports that he had a bowel movement today with no blood. He denies vomiting, fever, constipation, or any other symptoms.    (Consider location/radiation/quality/duration/timing/severity/associated sxs/prior Treatment) Patient is a 28 y.o. male presenting with abdominal pain. The history is provided by the patient and medical records. No language interpreter was used.  Abdominal Pain Pain location:  Periumbilical Pain quality: aching   Pain radiates to:  Does not radiate Pain severity:  Moderate Onset quality:  Gradual Timing:  Intermittent Progression:  Worsening Chronicity:  New   Past Medical History  Diagnosis Date  . Clotting disorder   . Pulmonary embolism   . DVT (deep venous thrombosis)    Past Surgical History  Procedure Laterality Date  . Hemorroidectomy     No family history on file. History  Substance Use Topics  . Smoking status: Never Smoker   . Smokeless tobacco: Never Used  . Alcohol Use: No    Review of Systems  Gastrointestinal: Positive for abdominal pain.  All other systems reviewed and are negative.     Allergies  Review of patient's allergies indicates no known allergies.  Home Medications   Prior to Admission  medications   Medication Sig Start Date End Date Taking? Authorizing Provider  aspirin-acetaminophen-caffeine (EXCEDRIN MIGRAINE) [phone] MG per tablet Take 2 tablets by mouth daily as needed (pain).   Yes Historical Provider, MD  ibuprofen (ADVIL,MOTRIN) 200 MG tablet Take 400 mg by mouth 2 (two) times daily as needed (pain).   Yes Historical Provider, MD  rivaroxaban (XARELTO) 20 MG TABS tablet Take 20 mg by mouth daily.    Yes Historical Provider, MD  polyethylene glycol (MIRALAX / GLYCOLAX) packet Take 17 g by mouth 2 (two) times daily. Until stooling regularly 07/22/13   Leona SingletonMaria T Thekkekandam, MD   BP 122/72  Pulse 70  Temp(Src) 98 F (36.7 C) (Oral)  Resp 18  SpO2 99% Physical Exam  Nursing note and vitals reviewed. Constitutional: He is oriented to person, place, and time. He appears well-developed and well-nourished.  HENT:  Head: Normocephalic and atraumatic.  Right Ear: External ear normal.  Left Ear: External ear normal.  Nose: Nose normal.  Eyes: Conjunctivae and EOM are normal. Pupils are equal, round, and reactive to light.  Neck: Normal range of motion. Neck supple.  Cardiovascular: Normal rate, regular rhythm, normal heart sounds and intact distal pulses.   Pulmonary/Chest: Effort normal and breath sounds normal. No respiratory distress. He exhibits no tenderness.  Abdominal: Soft. Bowel sounds are normal. He exhibits no distension. There is tenderness (Patient with small ventral hernia above umbilicus that is easily reducible. There is mild tenderness to palpation over area.). There is no rebound and no guarding.  Genitourinary: Testes normal  and penis normal. Cremasteric reflex is present.  Musculoskeletal: Normal range of motion. He exhibits no edema and no tenderness.  Neurological: He is alert and oriented to person, place, and time.  Skin: Skin is warm and dry.  Psychiatric: He has a normal mood and affect.    ED Course  Procedures (including critical care  time) Labs Review Labs Reviewed  CBC WITH DIFFERENTIAL - Abnormal; Notable for the following:    RDW 16.1 (*)    Platelets 143 (*)    Neutrophils Relative % 36 (*)    Lymphocytes Relative 50 (*)    Basophils Relative 2 (*)    Basophils Absolute 0.2 (*)    All other components within normal limits  COMPREHENSIVE METABOLIC PANEL - Abnormal; Notable for the following:    BUN 24 (*)    AST 75 (*)    ALT 149 (*)    GFR calc non Af Amer 74 (*)    GFR calc Af Amer 85 (*)    All other components within normal limits  I-STAT CG4 LACTIC ACID, ED    Imaging Review No results found.   EKG Interpretation None      MDM   Final diagnoses:  Abdominal pain    Patient is a 28 year old male who presents with concerns of abdominal hernia. His vital signs were unremarkable including no fever, no tachycardia, no hypotension. Physical exam showed an easily reduced small ventral wall hernia. As the physical exam was unremarkable including normal testicular exam. This patient was concern that hernia was irreducible labs including CBC lactic acid were obtained. There is no evidence of lactic acidosis or leukocytosis. Given benign abdominal exam, hernia easily reduced, and unremarkable workup it was felt that discharge with general surgery followup for elective surgery was warranted.    Johnney Ou, MD 07/24/13 [phone]

## 2013-07-23 NOTE — ED Notes (Addendum)
Pt reports left inguinal hernia x 5/21 that is increasingly worsening. States he was seen here yesterday for same, but states today the hernia is hard and "I can't push it back in." Denies fever/ chills. Denies urinary retention. AO x4.

## 2013-07-24 NOTE — ED Provider Notes (Signed)
I saw and evaluated the patient, reviewed the resident's note and I agree with the findings and plan.   EKG Interpretation None       No evidence of incarcerated/strangulated hernia. Will refer for outpatient elective repair  Audree Camel, MD 07/24/13 1705

## 2018-07-29 ENCOUNTER — Emergency Department (HOSPITAL_COMMUNITY)
Admission: EM | Admit: 2018-07-29 | Discharge: 2018-07-29 | Disposition: A | Discharge status: Home / Self Care | Payer: Self-pay | Attending: Emergency Medicine | Admitting: Emergency Medicine

## 2018-07-29 ENCOUNTER — Other Ambulatory Visit: Payer: Self-pay

## 2018-07-29 ENCOUNTER — Encounter (HOSPITAL_COMMUNITY): Payer: Self-pay | Admitting: *Deleted

## 2018-07-29 DIAGNOSIS — K644 Residual hemorrhoidal skin tags: Secondary | ICD-10-CM | POA: Insufficient documentation

## 2018-07-29 DIAGNOSIS — Z7901 Long term (current) use of anticoagulants: Secondary | ICD-10-CM | POA: Insufficient documentation

## 2018-07-29 MED ORDER — OXYCODONE-ACETAMINOPHEN 5-325 MG PO TABS: 1.0000 | ORAL_TABLET | Freq: Four times a day (QID) | ORAL | 0 refills | Status: AC | PRN

## 2018-07-29 NOTE — Discharge Instructions (Signed)
Please read attached information. If you experience any new or worsening signs or symptoms please return to the emergency room for evaluation. Please follow-up with your primary care provider or specialist as discussed. Please use medication prescribed only as directed and discontinue taking if you have any concerning signs or symptoms.   °

## 2018-07-29 NOTE — ED Provider Notes (Signed)
[facility] EMERGENCY DEPARTMENT Provider Note   CSN: [id number] Arrival date & time: 07/29/18  1223    History   Chief Complaint Chief Complaint  Patient presents with  . Hemorrhoids    HPI Scott Baldwin is a 33 y.o. male.     HPI   33 year old male presents today with complaints of hemorrhoids.  Patient notes a history of the same.  Patient notes 2-day history of external hemorrhoids.  He notes he has been using over-the-counter topical medications without improvement in symptoms.  Patient notes that he has required bedside incision and drainage, hemorrhoidectomy.  He notes that he is done some yard work recently and does [employer].  He was seen at the [place] who referred him to the emergency room today for help with further disposition.    Past Medical History:  Diagnosis Date  . Clotting disorder (HCC)   . DVT (deep venous thrombosis) (HCC)   . Pulmonary embolism (HCC)     There are no active problems to display for this patient.   Past Surgical History:  Procedure Laterality Date  . HEMORROIDECTOMY          Home Medications    Prior to Admission medications   Medication Sig Start Date End Date Taking? Authorizing Provider  aspirin-acetaminophen-caffeine (EXCEDRIN MIGRAINE) [phone] MG per tablet Take 2 tablets by mouth daily as needed (pain).    [provider]  HYDROcodone-acetaminophen (NORCO) 5-325 MG per tablet Take 2 tablets by mouth every 4 (four) hours as needed. 07/23/13   Johnney Ou, MD  ibuprofen (ADVIL,MOTRIN) 200 MG tablet Take 400 mg by mouth 2 (two) times daily as needed (pain).    [provider]  oxyCODONE-acetaminophen (PERCOCET/ROXICET) 5-325 MG tablet Take 1 tablet by mouth every 6 (six) hours as needed. 07/29/18   Loyd Salvador, Tinnie Gens, PA-C  polyethylene glycol (MIRALAX / GLYCOLAX) packet Take 17 g by mouth 2 (two) times daily. Until stooling regularly 07/22/13   Leona Singleton, MD  rivaroxaban  (XARELTO) 20 MG TABS tablet Take 20 mg by mouth daily.     [provider]    Family History History reviewed. No pertinent family history.  Social History Social History   Tobacco Use  . Smoking status: Never Smoker  . Smokeless tobacco: Never Used  Substance Use Topics  . Alcohol use: No  . Drug use: No     Allergies   Patient has no known allergies.   Review of Systems Review of Systems  All other systems reviewed and are negative.    Physical Exam Updated Vital Signs BP 137/85 (BP Location: Left Arm)   Pulse 84   Temp 98.2 F (36.8 C) (Oral)   Resp 16   SpO2 100%   Physical Exam Vitals signs and nursing note reviewed.  Constitutional:      Appearance: He is well-developed.  HENT:     Head: Normocephalic and atraumatic.  Eyes:     General: No scleral icterus.       Right eye: No discharge.        Left eye: No discharge.     Conjunctiva/sclera: Conjunctivae normal.     Pupils: Pupils are equal, round, and reactive to light.  Neck:     Musculoskeletal: Normal range of motion.     Vascular: No JVD.     Trachea: No tracheal deviation.  Pulmonary:     Effort: Pulmonary effort is normal.     Breath sounds: No stridor.  Genitourinary:  Comments: Several large external hemorrhoids unable to perform physical exam secondary to discomfort Neurological:     Mental Status: He is alert and oriented to person, place, and time.     Coordination: Coordination normal.  Psychiatric:        Behavior: Behavior normal.        Thought Content: Thought content normal.        Judgment: Judgment normal.      ED Treatments / Results  Labs (all labs ordered are listed, but only abnormal results are displayed) Labs Reviewed - No data to display  EKG None  Radiology No results found.  Procedures Procedures (including critical care time)  Medications Ordered in ED Medications - No data to display   Initial Impression / Assessment and Plan / ED  Course  I have reviewed the triage vital signs and the nursing notes.  Pertinent labs & imaging results that were available during my care of the patient were reviewed by me and considered in my medical decision making (see chart for details).        33 year old male presents today with hemorrhoids.  He has a history of the same is identical to previous.  Will refer to general surgery for management.  He was given pain medication, encouraged to continue using over-the-counter topical creams, return immediately if he develops any new or worsening signs or symptoms.  Verbalized understanding and agreement to today's plan had no further questions or concerns.  Final Clinical Impressions(s) / ED Diagnoses   Final diagnoses:  External hemorrhoid    ED Discharge Orders         Ordered    oxyCODONE-acetaminophen (PERCOCET/ROXICET) 5-325 MG tablet  Every 6 hours PRN     07/29/18 1502           Eyvonne MechanicHedges, Paxson Harrower, PA-C 07/29/18 1836    Geoffery Lyonselo, [place], MD 07/30/18 2135

## 2018-07-29 NOTE — ED Triage Notes (Signed)
Pt in c/o hemorrhoids, is normally seen at the [place] but they recommended that he come here for evaluation, no distress noted
